# Patient Record
Sex: Female | Born: 1935 | Race: White | Hispanic: No | Smoking: Former smoker
Health system: Southern US, Community
[De-identification: ages and names within clinical notes are randomized; demographics above are authoritative.]

## PROBLEM LIST (undated history)

## (undated) DIAGNOSIS — I10 Essential (primary) hypertension: Secondary | ICD-10-CM

## (undated) HISTORY — PX: HIP SURGERY: SHX245

## (undated) HISTORY — PX: CHOLECYSTECTOMY: SHX55

## (undated) HISTORY — PX: OTHER SURGICAL HISTORY: SHX169

---

## 2015-07-30 ENCOUNTER — Emergency Department
Admission: EM | Admit: 2015-07-30 | Discharge: 2015-07-30 | Disposition: A | Discharge status: Home / Self Care | Payer: Medicare Other | Attending: Emergency Medicine | Admitting: Emergency Medicine

## 2015-07-30 ENCOUNTER — Encounter: Payer: Self-pay | Admitting: Emergency Medicine

## 2015-07-30 ENCOUNTER — Emergency Department: Payer: Medicare Other

## 2015-07-30 DIAGNOSIS — Z79899 Other long term (current) drug therapy: Secondary | ICD-10-CM | POA: Diagnosis not present

## 2015-07-30 DIAGNOSIS — M791 Myalgia: Secondary | ICD-10-CM | POA: Diagnosis present

## 2015-07-30 DIAGNOSIS — Z87891 Personal history of nicotine dependence: Secondary | ICD-10-CM | POA: Diagnosis not present

## 2015-07-30 DIAGNOSIS — M7918 Myalgia, other site: Secondary | ICD-10-CM

## 2015-07-30 DIAGNOSIS — Y9289 Other specified places as the place of occurrence of the external cause: Secondary | ICD-10-CM | POA: Insufficient documentation

## 2015-07-30 DIAGNOSIS — Y939 Activity, unspecified: Secondary | ICD-10-CM | POA: Diagnosis not present

## 2015-07-30 DIAGNOSIS — I10 Essential (primary) hypertension: Secondary | ICD-10-CM | POA: Insufficient documentation

## 2015-07-30 DIAGNOSIS — W06XXXA Fall from bed, initial encounter: Secondary | ICD-10-CM | POA: Diagnosis not present

## 2015-07-30 DIAGNOSIS — Z791 Long term (current) use of non-steroidal anti-inflammatories (NSAID): Secondary | ICD-10-CM | POA: Diagnosis not present

## 2015-07-30 DIAGNOSIS — Y999 Unspecified external cause status: Secondary | ICD-10-CM | POA: Diagnosis not present

## 2015-07-30 DIAGNOSIS — W19XXXA Unspecified fall, initial encounter: Secondary | ICD-10-CM

## 2015-07-30 HISTORY — DX: Essential (primary) hypertension: I10

## 2015-07-30 LAB — URINALYSIS COMPLETE WITH MICROSCOPIC (ARMC ONLY)
BILIRUBIN URINE: NEGATIVE
Bacteria, UA: NONE SEEN
GLUCOSE, UA: NEGATIVE mg/dL
KETONES UR: NEGATIVE mg/dL
LEUKOCYTES UA: NEGATIVE
Nitrite: NEGATIVE
PH: 8 (ref 5.0–8.0)
Protein, ur: NEGATIVE mg/dL
Specific Gravity, Urine: 1.008 (ref 1.005–1.030)

## 2015-07-30 LAB — CBC
HCT: 33.9 % — ABNORMAL LOW (ref 35.0–47.0)
HEMOGLOBIN: 11.3 g/dL — AB (ref 12.0–16.0)
MCH: 30 pg (ref 26.0–34.0)
MCHC: 33.3 g/dL (ref 32.0–36.0)
MCV: 90.1 fL (ref 80.0–100.0)
PLATELETS: 489 10*3/uL — AB (ref 150–440)
RBC: 3.77 MIL/uL — AB (ref 3.80–5.20)
RDW: 15.1 % — ABNORMAL HIGH (ref 11.5–14.5)
WBC: 8.5 10*3/uL (ref 3.6–11.0)

## 2015-07-30 LAB — BASIC METABOLIC PANEL
Anion gap: 8 (ref 5–15)
BUN: 17 mg/dL (ref 6–20)
CHLORIDE: 103 mmol/L (ref 101–111)
CO2: 28 mmol/L (ref 22–32)
CREATININE: 0.5 mg/dL (ref 0.44–1.00)
Calcium: 9.7 mg/dL (ref 8.9–10.3)
GFR calc Af Amer: >60 mL/min (ref >60)
GFR calc non Af Amer: >60 mL/min (ref >60)
GLUCOSE: 85 mg/dL (ref 65–99)
POTASSIUM: 3.9 mmol/L (ref 3.5–5.1)
Sodium: 139 mmol/L (ref 135–145)

## 2015-07-30 LAB — TROPONIN I: Troponin I: <0.03 ng/mL (ref <0.031)

## 2015-07-30 MED ORDER — MORPHINE SULFATE (PF) 4 MG/ML IV SOLN: 4.0000 mg | Freq: Once | INTRAVENOUS | Status: DC

## 2015-07-30 MED ORDER — ONDANSETRON HCL 4 MG/2ML IJ SOLN: 4.0000 mg | Freq: Once | INTRAMUSCULAR | Status: DC

## 2015-07-30 NOTE — ED Notes (Signed)
This nurse and Kennith Centerracey, EDT ambulated patient in room with walker.  Pt walked several steps with some assistance under arms.  Attempted to call [facility] to inquire about patient's baseline walking, no answer, will attempt to call back.

## 2015-07-30 NOTE — ED Notes (Signed)
This nurse spoke with Lavern at [facility] who states patient uses wheelchair and has assistance from wheelchair to bed.  Report given to Lavern about patient's discharge.  Discharge understood and no further questions for this nurse at this time.

## 2015-07-30 NOTE — ED Notes (Signed)
Pt. States she fell this evening on her "buttocks".  Pt. Denies LOC or hitting her head.  Pt. Appears to have older laceration on lt. Orbital.  Pt. Has bruising lt. Hip, which also looks like an older injury.  Pt. Is not a good historian.  Pt. Has good strength in both lower extremities.  Pt. Has not complaints at this time.

## 2015-07-30 NOTE — Discharge Instructions (Signed)

## 2015-07-30 NOTE — ED Provider Notes (Signed)
[facility] Emergency Department Provider Note   ____________________________________________  Time seen: Approximately 546 AM  I have reviewed the triage vital signs and the nursing notes.   HISTORY  Chief Complaint Fall    HPI Raynelle Fujikawa is a 82 y.o. female comes into the hospital today after a fall. The patient reports that she fell onto her buttocks. She reports that she does not remember exactly how she fell and she thinks she may have rolled off the bed. The patient denies hitting her head and she reports she does fall frequently. She has no chest pain no shortness of breath no nausea or vomiting. She reports that her low back hurts after the fall but currently her pain is a 0 out of 10 in intensity. The patient reports that she is here to be evaluated him checked out because she fell. She did not take anything for pain and reports that she is able to move everything at this time.   Past Medical History  Diagnosis Date  . Hypertension     There are no active problems to display for this patient.   Past Surgical History  Procedure Laterality Date  . Cholecystectomy    . Hip surgery    . Dnc      Current Outpatient Rx  Name  Route  Sig  Dispense  Refill  . acetaminophen (TYLENOL) 325 MG tablet   Oral   Take 975 mg by mouth 2 (two) times daily.         . Amino Acids-Protein Hydrolys (FEEDING SUPPLEMENT, PRO-STAT SUGAR FREE 64,) LIQD   Oral   Take 30 mLs by mouth 2 (two) times daily.         Marland Kitchen donepezil (ARICEPT) 5 MG tablet   Oral   Take 5 mg by mouth at bedtime.         Marland Kitchen OLANZapine (ZYPREXA) 2.5 MG tablet   Oral   Take 2.5 mg by mouth 3 (three) times daily.           Allergies Review of patient's allergies indicates no known allergies.  No family history on file.  Social History Social History  Substance Use Topics  . Smoking status: Former Games developer  . Smokeless tobacco: None  . Alcohol Use: Yes    Review  of Systems Constitutional: No fever/chills Eyes: No visual changes. ENT: No sore throat. Cardiovascular: Denies chest pain. Respiratory: Denies shortness of breath. Gastrointestinal: No abdominal pain.  No nausea, no vomiting.  No diarrhea.  No constipation. Genitourinary: Negative for dysuria. Musculoskeletal:  back pain. Skin: Negative for rash. Neurological: Negative for headaches, focal weakness or numbness.  10-point ROS otherwise negative.  ____________________________________________   PHYSICAL EXAM:  VITAL SIGNS: ED Triage Vitals  Enc Vitals Group     BP 07/30/15 0515 128/71 mmHg     Pulse Rate 07/30/15 0515 73     Resp 07/30/15 0515 18     Temp 07/30/15 0515 97.8 F (36.6 C)     Temp Source 07/30/15 0515 Oral     SpO2 07/30/15 0515 99 %     Weight 07/30/15 0515 140 lb (63.504 kg)     Height 07/30/15 0515  (1.676 m)     Head Cir --      Peak Flow --      Pain Score --      Pain Loc --      Pain Edu? --      Excl. in GC? --  Constitutional: Alert . Well appearing and in no acute distress. Eyes: Conjunctivae are normal. PERRL. EOMI. Head: Atraumatic. Nose: No congestion/rhinnorhea. Mouth/Throat: Mucous membranes are moist.  Oropharynx non-erythematous. Cardiovascular: Normal rate, regular rhythm. Grossly normal heart sounds.  Good peripheral circulation. Respiratory: Normal respiratory effort.  No retractions. Lungs CTAB. Gastrointestinal: Soft and nontender. No distention. Positive bowel sounds Musculoskeletal: No pelvis tenderness to palpation, no low back tenderness to palpation.. Neurologic:  Normal speech and language.  Skin:  Skin is warm, dry and intact.  Psychiatric: Mood and affect are normal.   ____________________________________________   LABS (all labs ordered are listed, but only abnormal results are displayed)  Labs Reviewed  URINALYSIS COMPLETEWITH MICROSCOPIC ([facility] ONLY) - Abnormal; Notable for the following:    Color, Urine  YELLOW (*)    APPearance CLOUDY (*)    Hgb urine dipstick 1+ (*)    Squamous Epithelial / LPF 0-5 (*)    All other components within normal limits  CBC - Abnormal; Notable for the following:    RBC 3.77 (*)    Hemoglobin 11.3 (*)    HCT 33.9 (*)    RDW 15.1 (*)    Platelets 489 (*)    All other components within normal limits  BASIC METABOLIC PANEL  TROPONIN I   ____________________________________________  EKG  None ____________________________________________  RADIOLOGY  Lumbar spine x-ray: Negative for acute lumbar spine fracture  Pelvis x-ray: No acute abnormality. ____________________________________________   PROCEDURES  Procedure(s) performed: None  Critical Care performed: No  ____________________________________________   INITIAL IMPRESSION / ASSESSMENT AND PLAN / ED COURSE  Pertinent labs & imaging results that were available during my care of the patient were reviewed by me and considered in my medical decision making (see chart for details).  This is an 82 year old female who comes into the hospital tonight after a fall. The patient reports that she does not know exactly how she fell but she landed on her buttocks. The patient's x-rays at this time are unremarkable. The patient's urinalysis is also unremarkable. I will have the patient ambulate to determine if she is having irregular pain with her blood work returned she will be dispositioned.  The patient's blood work is unremarkable. We will have the patient walk around to determine if she has any worsened pain if she is doing well she'll be discharged home. ____________________________________________   FINAL CLINICAL IMPRESSION(S) / ED DIAGNOSES  Final diagnoses:  Fall, initial encounter  Musculoskeletal pain      NEW MEDICATIONS STARTED DURING THIS VISIT:  New Prescriptions   No medications on file     Note:  This document was prepared using Dragon voice recognition software and may  include unintentional dictation errors.    Rebecka ApleyAllison P Glendale Wherry, MD 07/30/15 [phone]

## 2015-07-30 NOTE — ED Provider Notes (Signed)
Patient was able to walk minimally, she has been accepted back to [facility]. Apparently is for the most part wheelchair bound at baseline.  Misty FilbertJonathan E Brettany Sydney, MD 07/30/15 1226

## 2015-07-30 NOTE — ED Notes (Signed)
Pt. States she does not remember fall.  Pt. Denies hitting head. Pt. Reported to have dementia.

## 2015-07-30 NOTE — ED Notes (Signed)
This nurse attempted to ambulate patient in room.  Pt having a lot of difficulty, states "this doesn't feel right".  Pt very weak and unsteady.  MD notified.

## 2016-02-19 ENCOUNTER — Encounter: Payer: Self-pay | Admitting: Emergency Medicine

## 2016-02-19 ENCOUNTER — Emergency Department: Payer: Medicare Other

## 2016-02-19 ENCOUNTER — Inpatient Hospital Stay
Admission: EM | Admit: 2016-02-19 | Discharge: 2016-02-20 | DRG: 871 | Disposition: A | Discharge status: Hospice | Payer: Medicare Other | Attending: Internal Medicine | Admitting: Internal Medicine

## 2016-02-19 DIAGNOSIS — E870 Hyperosmolality and hypernatremia: Secondary | ICD-10-CM | POA: Diagnosis present

## 2016-02-19 DIAGNOSIS — A419 Sepsis, unspecified organism: Secondary | ICD-10-CM | POA: Diagnosis present

## 2016-02-19 DIAGNOSIS — E860 Dehydration: Secondary | ICD-10-CM | POA: Diagnosis present

## 2016-02-19 DIAGNOSIS — R0902 Hypoxemia: Secondary | ICD-10-CM | POA: Diagnosis present

## 2016-02-19 DIAGNOSIS — E872 Acidosis: Secondary | ICD-10-CM | POA: Diagnosis present

## 2016-02-19 DIAGNOSIS — Z87891 Personal history of nicotine dependence: Secondary | ICD-10-CM

## 2016-02-19 DIAGNOSIS — R627 Adult failure to thrive: Secondary | ICD-10-CM | POA: Diagnosis present

## 2016-02-19 DIAGNOSIS — F419 Anxiety disorder, unspecified: Secondary | ICD-10-CM | POA: Diagnosis present

## 2016-02-19 DIAGNOSIS — N179 Acute kidney failure, unspecified: Secondary | ICD-10-CM | POA: Diagnosis present

## 2016-02-19 DIAGNOSIS — E878 Other disorders of electrolyte and fluid balance, not elsewhere classified: Secondary | ICD-10-CM | POA: Diagnosis present

## 2016-02-19 DIAGNOSIS — Z515 Encounter for palliative care: Secondary | ICD-10-CM | POA: Diagnosis present

## 2016-02-19 DIAGNOSIS — Z7189 Other specified counseling: Secondary | ICD-10-CM | POA: Diagnosis not present

## 2016-02-19 DIAGNOSIS — N390 Urinary tract infection, site not specified: Secondary | ICD-10-CM | POA: Diagnosis present

## 2016-02-19 DIAGNOSIS — R571 Hypovolemic shock: Secondary | ICD-10-CM | POA: Diagnosis present

## 2016-02-19 DIAGNOSIS — F0390 Unspecified dementia without behavioral disturbance: Secondary | ICD-10-CM | POA: Diagnosis present

## 2016-02-19 DIAGNOSIS — Z7401 Bed confinement status: Secondary | ICD-10-CM

## 2016-02-19 DIAGNOSIS — R6520 Severe sepsis without septic shock: Secondary | ICD-10-CM | POA: Diagnosis present

## 2016-02-19 DIAGNOSIS — Z66 Do not resuscitate: Secondary | ICD-10-CM | POA: Diagnosis present

## 2016-02-19 DIAGNOSIS — I10 Essential (primary) hypertension: Secondary | ICD-10-CM | POA: Diagnosis present

## 2016-02-19 DIAGNOSIS — L8990 Pressure ulcer of unspecified site, unspecified stage: Secondary | ICD-10-CM | POA: Insufficient documentation

## 2016-02-19 DIAGNOSIS — R0603 Acute respiratory distress: Secondary | ICD-10-CM | POA: Diagnosis not present

## 2016-02-19 LAB — CBC WITH DIFFERENTIAL/PLATELET
Basophils Absolute: 0 10*3/uL (ref 0–0.1)
Basophils Relative: 0 %
EOS ABS: 0 10*3/uL (ref 0–0.7)
EOS PCT: 0 %
HCT: 42.9 % (ref 35.0–47.0)
Hemoglobin: 12.8 g/dL (ref 12.0–16.0)
LYMPHS ABS: 1.6 10*3/uL (ref 1.0–3.6)
Lymphocytes Relative: 6 %
MCH: 30.4 pg (ref 26.0–34.0)
MCHC: 29.9 g/dL — AB (ref 32.0–36.0)
MCV: 101.5 fL — ABNORMAL HIGH (ref 80.0–100.0)
MONO ABS: 0.8 10*3/uL (ref 0.2–0.9)
Monocytes Relative: 3 %
Neutro Abs: 23.1 10*3/uL — ABNORMAL HIGH (ref 1.4–6.5)
Neutrophils Relative %: 91 %
PLATELETS: 352 10*3/uL (ref 150–440)
RBC: 4.22 MIL/uL (ref 3.80–5.20)
RDW: 16.7 % — AB (ref 11.5–14.5)
WBC: 25.5 10*3/uL — AB (ref 3.6–11.0)

## 2016-02-19 LAB — COMPREHENSIVE METABOLIC PANEL
ALK PHOS: 73 U/L (ref 38–126)
ALT: 17 U/L (ref 14–54)
AST: 33 U/L (ref 15–41)
Albumin: 2.9 g/dL — ABNORMAL LOW (ref 3.5–5.0)
BUN: 111 mg/dL — AB (ref 6–20)
CALCIUM: 8.5 mg/dL — AB (ref 8.9–10.3)
CO2: 18 mmol/L — AB (ref 22–32)
CREATININE: 2.67 mg/dL — AB (ref 0.44–1.00)
Chloride: >130 mmol/L (ref 101–111)
GFR, EST AFRICAN AMERICAN: 18 mL/min — AB (ref >60)
GFR, EST NON AFRICAN AMERICAN: 15 mL/min — AB (ref >60)
Glucose, Bld: 143 mg/dL — ABNORMAL HIGH (ref 65–99)
Potassium: 4 mmol/L (ref 3.5–5.1)
SODIUM: 169 mmol/L — AB (ref 135–145)
Total Bilirubin: 0.8 mg/dL (ref 0.3–1.2)
Total Protein: 6.6 g/dL (ref 6.5–8.1)

## 2016-02-19 LAB — URINALYSIS COMPLETE WITH MICROSCOPIC (ARMC ONLY)
BACTERIA UA: NONE SEEN
Bilirubin Urine: NEGATIVE
Glucose, UA: NEGATIVE mg/dL
Hgb urine dipstick: NEGATIVE
KETONES UR: NEGATIVE mg/dL
Nitrite: NEGATIVE
PH: 8 (ref 5.0–8.0)
SQUAMOUS EPITHELIAL / LPF: NONE SEEN
Specific Gravity, Urine: 1.018 (ref 1.005–1.030)

## 2016-02-19 LAB — BLOOD GAS, ARTERIAL
Acid-base deficit: 1.1 mmol/L (ref 0.0–2.0)
BICARBONATE: 22.6 mmol/L (ref 20.0–28.0)
FIO2: 1
O2 Saturation: 93.6 %
PATIENT TEMPERATURE: 37
pCO2 arterial: 34 mmHg (ref 32.0–48.0)
pH, Arterial: 7.43 (ref 7.350–7.450)
pO2, Arterial: 67 mmHg — ABNORMAL LOW (ref 83.0–108.0)

## 2016-02-19 LAB — TROPONIN I: Troponin I: 0.12 ng/mL (ref <0.03)

## 2016-02-19 LAB — MRSA PCR SCREENING: MRSA by PCR: NEGATIVE

## 2016-02-19 LAB — LACTIC ACID, PLASMA
LACTIC ACID, VENOUS: 2.5 mmol/L — AB (ref 0.5–1.9)
Lactic Acid, Venous: 4.1 mmol/L (ref 0.5–1.9)

## 2016-02-19 MED ORDER — ONDANSETRON HCL 4 MG/2ML IJ SOLN: 4.0000 mg | Freq: Four times a day (QID) | INTRAMUSCULAR | Status: DC | PRN

## 2016-02-19 MED ORDER — MEROPENEM-SODIUM CHLORIDE 1 GM/50ML IV SOLR
1.0000 g | Freq: Once | INTRAVENOUS | Status: DC
  Filled 2016-02-19: qty 50

## 2016-02-19 MED ORDER — ACETAMINOPHEN 325 MG PO TABS: 650.0000 mg | ORAL_TABLET | Freq: Four times a day (QID) | ORAL | Status: DC | PRN

## 2016-02-19 MED ORDER — ACETAMINOPHEN 650 MG RE SUPP: 650.0000 mg | Freq: Four times a day (QID) | RECTAL | Status: DC | PRN

## 2016-02-19 MED ORDER — MORPHINE SULFATE (PF) 4 MG/ML IV SOLN
1.0000 mg | INTRAVENOUS | Status: DC | PRN
  Filled 2016-02-19: qty 1

## 2016-02-19 MED ORDER — SODIUM CHLORIDE 0.9 % IV BOLUS (SEPSIS)
1000.0000 mL | Freq: Once | INTRAVENOUS | Status: AC
  Administered 2016-02-19: 1000 mL via INTRAVENOUS

## 2016-02-19 MED ORDER — POLYVINYL ALCOHOL 1.4 % OP SOLN
1.0000 [drp] | OPHTHALMIC | Status: DC | PRN
  Filled 2016-02-19: qty 15

## 2016-02-19 MED ORDER — MEROPENEM-SODIUM CHLORIDE 500 MG/50ML IV SOLR
500.0000 mg | Freq: Two times a day (BID) | INTRAVENOUS | Status: DC
  Filled 2016-02-19: qty 50

## 2016-02-19 MED ORDER — ONDANSETRON HCL 4 MG PO TABS: 4.0000 mg | ORAL_TABLET | Freq: Four times a day (QID) | ORAL | Status: DC | PRN

## 2016-02-19 MED ORDER — DEXTROSE 5 % IV SOLN: INTRAVENOUS | Status: DC

## 2016-02-19 MED ORDER — MORPHINE SULFATE (PF) 4 MG/ML IV SOLN
1.0000 mg | INTRAVENOUS | Status: DC | PRN
  Administered 2016-02-19: 1 mg via INTRAVENOUS

## 2016-02-19 MED ORDER — LORAZEPAM 2 MG/ML IJ SOLN: 1.0000 mg | INTRAMUSCULAR | Status: DC | PRN

## 2016-02-19 MED ORDER — PIPERACILLIN-TAZOBACTAM 3.375 G IVPB 30 MIN
3.3750 g | Freq: Once | INTRAVENOUS | Status: AC
  Administered 2016-02-19: 3.375 g via INTRAVENOUS
  Filled 2016-02-19: qty 50

## 2016-02-19 MED ORDER — VANCOMYCIN HCL IN DEXTROSE 1-5 GM/200ML-% IV SOLN
1000.0000 mg | Freq: Once | INTRAVENOUS | Status: AC
  Administered 2016-02-19: 1000 mg via INTRAVENOUS
  Filled 2016-02-19: qty 200

## 2016-02-19 MED ORDER — MORPHINE SULFATE (PF) 4 MG/ML IV SOLN: 0.5000 mg | INTRAVENOUS | Status: DC | PRN

## 2016-02-19 MED ORDER — MEROPENEM 1 G IV SOLR: 1.0000 g | Freq: Three times a day (TID) | INTRAVENOUS | Status: DC

## 2016-02-19 MED ORDER — HEPARIN SODIUM (PORCINE) 5000 UNIT/ML IJ SOLN: 5000.0000 [IU] | Freq: Three times a day (TID) | INTRAMUSCULAR | Status: DC

## 2016-02-19 MED ORDER — GLYCOPYRROLATE 0.2 MG/ML IJ SOLN
0.2000 mg | INTRAMUSCULAR | Status: DC | PRN
  Filled 2016-02-19: qty 1

## 2016-02-19 MED ORDER — MORPHINE SULFATE (PF) 2 MG/ML IV SOLN
INTRAVENOUS | Status: AC
  Administered 2016-02-19: 1 mg via INTRAVENOUS
  Filled 2016-02-19: qty 1

## 2016-02-19 NOTE — ED Triage Notes (Signed)
Pt for [facility] for respiratory distress. Per ems pt was 55% on room air . Pt arrived on NRB @15  liters. Pt with eyes open but not responsive to stimulation. Recent treatment for UTI.

## 2016-02-19 NOTE — Progress Notes (Signed)
CH responded to an OR for End of Life. Pt was in bed and non-responsive. Daughter and her friend were bedside. Daughter reflected on Pt and her life. CH provided the ministry of empathetic listening and prayer. CH is available for follow up as needed.    02/19/16 1500  Clinical Encounter Type  Visited With Family;Patient and family together  Visit Type Initial;Spiritual support;Patient actively dying  Referral From Nurse  Spiritual Encounters  Spiritual Needs Prayer;Emotional;Grief support  Stress Factors  Family Stress Factors Major life changes

## 2016-02-19 NOTE — Clinical Social Work Note (Signed)
Patient has been admitted this afternoon from [facility]. Patient's prognosis is grim and patient is not expected to survive this illness. CSW will wait to complete full assessment at this time. York SpanielMonica Jiayi Lengacher MSW,LCSW [phone]

## 2016-02-19 NOTE — Consult Note (Signed)
Consultation Note Date: 02/19/2016   Patient Name: Misty Mcclure  DOB: October 13, 1930  MRN: [id number]  Age / Sex: 82 y.o., female  PCP: Pcp Not In System Referring Physician: Fritzi Mandes, MD  Reason for Consultation: Establishing goals of care  HPI/Patient Profile: 82 y.o. female  with past medical history of dementia admitted on 02/19/2016 with altered mental status and hypoxia and found to have likely urosepsis. Unlikely to survive hospitalization.   Clinical Assessment and Goals of Care: I met today at Misty Mcclure's bedside along with her daughter, Vanita Ingles, and friend, Marlowe Kays. Vanita Ingles is tearful but is now ready for full comfort care. Vanita Ingles says that her mother was a vibrant person. 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 She was active in garden club and multiple social activities and cared for her husband when he had Lewy bodies dementia. Vanita Ingles says that the last 6 months have been extremely difficult and that her mother has no QOL. Vanita Ingles knows that her mother is ready to die. Vanita Ingles also feels like she saw all her children except for 1 son (currently flying from [place]) and most of her children over Thanksgiving and Vanita Ingles feels like she was just waiting for this. Misty Mcclure's goal is that her mother not suffer.   Emotional support and therapeutic listening provided. Chaplain enters at bedside. Will proceed with full comfort care and will consider hospice placement tomorrow if appropriate although I doubt that she will be stable enough for transfer if still living but will reassess in am. Discussed with Dr. Posey Pronto.   Primary Decision Maker NEXT OF KIN 4 children, daughter at bedside and has been discussing with her siblings    SUMMARY OF RECOMMENDATIONS   Full comfort care  Code Status/Advance Care Planning:  DNR   Symptom Management:   Dyspnea/pain: Morphine 1-3 mg every hour prn.   Secretions: Robinul prn.    Anxiety: Ativan prn.   Palliative Prophylaxis:   Aspiration, Frequent Pain Assessment, Oral Care and Turn Reposition  Additional Recommendations (Limitations, Scope, Preferences):  Full Comfort Care  Psycho-social/Spiritual:   Desire for further Chaplaincy support:yes  Additional Recommendations: Caregiving  Support/Resources, Education on Hospice and Grief/Bereavement Support  Prognosis:   Hours - Days  Discharge Planning: To Be Determined. Likely hospital death.       Primary Diagnoses: Present on Admission: . Severe sepsis ([place])   I have reviewed the medical record, interviewed the patient and family, and examined the patient. The following aspects are pertinent.  Past Medical History:  Diagnosis Date  . Hypertension    Social History   Social History  . Marital status: Widowed    Spouse name: N/A  . Number of children: N/A  . Years of education: N/A   Social History Main Topics  . Smoking status: Former Research scientist (life sciences)  . Smokeless tobacco: None  . Alcohol use Yes  . Drug use: Unknown  . Sexual activity: No   Other Topics Concern  . None   Social History Narrative   NA  History reviewed. No pertinent family history. Scheduled Meds: Continuous Infusions: PRN Meds:.acetaminophen **OR** acetaminophen, glycopyrrolate, LORazepam, morphine injection, ondansetron **OR** ondansetron (ZOFRAN) IV, polyvinyl alcohol No Known Allergies Review of Systems  Unable to perform ROS: Acuity of condition    Physical Exam  Constitutional: She appears well-developed.  HENT:  Head: Normocephalic and atraumatic.  Cardiovascular: Normal rate.   Pulmonary/Chest: Effort normal. No accessory muscle usage. No tachypnea. No respiratory distress.  Abdominal: Normal appearance.  Neurological: She is unresponsive.  Nursing note and vitals reviewed.   Vital Signs: BP (!) 92/58 (BP Location: Left Arm)   Pulse 79   Temp 97 F (36.1 C) (Axillary)   Resp (!) 22   Ht 5'  (1.524 m)   Wt 43.1 kg (95 lb)   SpO2 100%   BMI 18.55 kg/m          SpO2: SpO2: 100 % O2 Device:SpO2: 100 % O2 Flow Rate: .O2 Flow Rate (L/min): 15 L/min  IO: Intake/output summary:  Intake/Output Summary (Last 24 hours) at 02/19/16 1521 Last data filed at 02/19/16 1500  Gross per 24 hour  Intake                0 ml  Output                0 ml  Net                0 ml    LBM:   Baseline Weight: Weight: 43.1 kg (95 lb) Most recent weight: Weight: 43.1 kg (95 lb)     Palliative Assessment/Data: PPS: 10%   Flowsheet Rows   Flowsheet Row Most Recent Value  Intake Tab  Referral Department  Hospitalist  Unit at Time of Referral  ER  Palliative Care Primary Diagnosis  Sepsis/Infectious Disease  Date Notified  02/19/16  Palliative Care Type  New Palliative care  Reason for referral  Clarify Goals of Care  Date of Admission  02/19/16  # of days IP prior to Palliative referral  0  Clinical Assessment  Psychosocial & Spiritual Assessment  Palliative Care Outcomes      Time In: 1430 Time Out: 1530 Time Total: 51mn Greater than 50%  of this time was spent counseling and coordinating care related to the above assessment and plan.  Signed by: AVinie Sill NP Palliative Medicine Team Pager # [phone](M-F 8a-5p) Team Phone # [phone](Nights/Weekends)

## 2016-02-19 NOTE — Progress Notes (Signed)
Pharmacy Antibiotic Note  Bevelyn Bucklesauline Nikolic is a 82 y.o. female admitted on 02/19/2016 with sepsis.  Pharmacy has been consulted for meropenem dosing.  Plan: Meropenem 1000 mg dose ordered once. Will start meropenem 500 mg IV BID.  Height: 5' (152.4 cm) Weight: 95 lb (43.1 kg) IBW/kg (Calculated) : 45.5  Temp (24hrs), Avg:97 F (36.1 C), Min:97 F (36.1 C), Max:97 F (36.1 C)   Recent Labs Lab 02/19/16 0928 02/19/16 1314  WBC 25.5*  --   CREATININE 2.67*  --   LATICACIDVEN 4.1* 2.5*    Estimated Creatinine Clearance: 10.9 mL/min (by C-G formula based on SCr of 2.67 mg/dL (H)).    No Known Allergies  Antimicrobials this admission: meropenem 11/29 >>  Vancomycin and zosyn once in ED 11/29  Dose adjustments this admission:  Microbiology results:  Thank you for allowing pharmacy to be a part of this patient's care.  Cindi CarbonMary M Yordy Matton, PharmD 02/19/2016 2:46 PM

## 2016-02-19 NOTE — ED Notes (Signed)
Pt in bed with eyes open, pt will not to respond to verbal or painful stimuli, urine thick white with particles, daughter at bedside

## 2016-02-19 NOTE — ED Notes (Signed)
Critical labs: lactic acid 4.1, troponin 0.12, chloride greater than 130, reported to Dr Mayford KnifeWilliams

## 2016-02-19 NOTE — ED Provider Notes (Signed)
[facility] Emergency Department Provider Note     L5 caveat: Review of systems and history cannot be obtained. Patient is poorly responsive, has dementia   Time seen: ----------------------------------------- 9:28 AM on 02/19/2016 -----------------------------------------    I have reviewed the triage vital signs and the nursing notes.   HISTORY  Chief Complaint No chief complaint on file.    HPI Misty Mcclure is a 82 y.o. female brought from [facility] for respiratory distress. Oxygen saturations were noted to be in the 50s. Patient's baseline is altered with a history of dementia but she is less responsive than normal according to nursing home staff and EMS. Reportedly in route she was hypoxic despite a nonrebreather and hypotensive.   Past Medical History:  Diagnosis Date  . Hypertension     There are no active problems to display for this patient.   Past Surgical History:  Procedure Laterality Date  . CHOLECYSTECTOMY    . DNC    . HIP SURGERY      Allergies Patient has no known allergies.  Social History Social History  Substance Use Topics  . Smoking status: Former Games developermoker  . Smokeless tobacco: Not on file  . Alcohol use Yes    Review of Systems Unknown, positive for respiratory distress ____________________________________________   PHYSICAL EXAM:  VITAL SIGNS: ED Triage Vitals  Enc Vitals Group     BP      Pulse      Resp      Temp      Temp src      SpO2      Weight      Height      Head Circumference      Peak Flow      Pain Score      Pain Loc      Pain Edu?      Excl. in GC?     Constitutional: Patient's eyes are open but she is not alert or responsive Eyes: Conjunctivae are normal. PERRL.  ENT   Head: Normocephalic and atraumatic.   Nose: No congestion/rhinnorhea.   Mouth/Throat: Mucous membranes are moist.   Neck: No stridor. Cardiovascular: Normal rate, regular rhythm. No  murmurs, rubs, or gallops. Respiratory: Tachypnea with mostly clear breath sounds bilaterally, shallow breathing Gastrointestinal: Soft and nontender. Normal bowel sounds Musculoskeletal: Limited range of motion, no obvious edema Neurologic:  Patient occasionally withdraws from pain, otherwise unresponsive Skin:  Skin is warm, dry and intact. No rash noted. ____________________________________________  EKG: Interpreted by me. Sinus tachycardia with a rate of 100 bpm, normal PR interval, normal QRS width, normal QT, after anterior fascicular block  ____________________________________________  ED COURSE:  Pertinent labs & imaging results that were available during my care of the patient were reviewed by me and considered in my medical decision making (see chart for details). Clinical Course   Patient presents to ER with respiratory distress, likely pneumonia and sepsis. We will assess with labs and initiate sepsis protocol. Patient is DO NOT RESUSCITATE and DO NOT INTUBATE.  Procedures ____________________________________________   LABS (pertinent positives/negatives)  Labs Reviewed  LACTIC ACID, PLASMA - Abnormal; Notable for the following:       Result Value   Lactic Acid, Venous 4.1 (*)    All other components within normal limits  COMPREHENSIVE METABOLIC PANEL - Abnormal; Notable for the following:    Chloride >130 (*)    CO2 18 (*)    Glucose, Bld 143 (*)  Calcium 8.5 (*)    Albumin 2.9 (*)    All other components within normal limits  CBC WITH DIFFERENTIAL/PLATELET - Abnormal; Notable for the following:    WBC 25.5 (*)    MCV 101.5 (*)    MCHC 29.9 (*)    RDW 16.7 (*)    Neutro Abs 23.1 (*)    All other components within normal limits  URINALYSIS COMPLETEWITH MICROSCOPIC ([facility] ONLY) - Abnormal; Notable for the following:    Color, Urine AMBER (*)    APPearance TURBID (*)    Protein, ur >500 (*)    Leukocytes, UA 1+ (*)    All other components within normal  limits  BLOOD GAS, ARTERIAL - Abnormal; Notable for the following:    pO2, Arterial 67 (*)    All other components within normal limits  CULTURE, BLOOD (ROUTINE X 2)  CULTURE, BLOOD (ROUTINE X 2)  URINE CULTURE  LACTIC ACID, PLASMA  TROPONIN I  CRITICAL CARE Performed by: Emily FilbertWilliams, Clotee Schlicker E   Total critical care time: 30 minutes  Critical care time was exclusive of separately billable procedures and treating other patients.  Critical care was necessary to treat or prevent imminent or life-threatening deterioration.  Critical care was time spent personally by me on the following activities: development of treatment plan with patient and/or surrogate as well as nursing, discussions with consultants, evaluation of patient's response to treatment, examination of patient, obtaining history from patient or surrogate, ordering and performing treatments and interventions, ordering and review of laboratory studies, ordering and review of radiographic studies, pulse oximetry and re-evaluation of patient's condition.   RADIOLOGY Images were viewed by me  Chest x-ray IMPRESSION: Chronic bronchitic changes. No evidence of pneumonia nor pulmonary edema.  Thoracic aortic atherosclerosis. ____________________________________________  FINAL ASSESSMENT AND PLAN  Urosepsis, lactic acidosis, Dehydration  Plan: Patient with labs and imaging as dictated above. Family and patient have requested no heroic measures. We will continue management with IV fluids and IV antibiotics. Patient with a remarkable free water deficit and dehydration as well. We had initially started her on saline but will need to change her to less concentrated fluids. I have ordered vancomycin and Zosyn, she may need a dose of meropenem as well for ESBL. I will discuss with the hospitalist for admission.   Emily FilbertWilliams, Aundra Espin E, MD   Note: This dictation was prepared with Dragon dictation. Any transcriptional errors that  result from this process are unintentional    Emily FilbertJonathan E Nagi Furio, MD 02/19/16 1121

## 2016-02-19 NOTE — Progress Notes (Signed)
Family Meeting Note  Advance Directive:yes  Today a meeting took place with the daughter Misty Mcclure  Patient is unresponsive and unable to participate in today's meeting.   The following clinical team members were present during this meeting daughter and her friend The following were discussed:Patient's diagnosis: , Patient's progosis: Very poor Additional follow-up to be provided: Palliative care consulted   Patient's daughter understands patient is severely ill with sepsis severe due to urinary tract infection and severe dehydration with acute kidney injury and hypotension. Patient's son Misty Mcclure is on his way from [place]. We'll continue IV fluids and IV antibiotics. When necessary morphine for comfort. Daughter understands patient did not survive hospital stay. It's matter of days.  Patient is a no code DO NOT RESUSCITATE. Family agreeable with hospice facility if patient shows no improvement.  Time spent during discussion:20 mins  Misty Bert, MD

## 2016-02-20 DIAGNOSIS — L8990 Pressure ulcer of unspecified site, unspecified stage: Secondary | ICD-10-CM | POA: Insufficient documentation

## 2016-02-20 LAB — URINE CULTURE: Culture: NO GROWTH

## 2016-02-20 MED ORDER — MORPHINE SULFATE (CONCENTRATE) 10 MG/0.5ML PO SOLN: 5.0000 mg | ORAL | 0 refills | Status: AC | PRN

## 2016-02-20 MED ORDER — MORPHINE SULFATE (CONCENTRATE) 10 MG/0.5ML PO SOLN
5.0000 mg | ORAL | Status: DC | PRN
  Administered 2016-02-20 (×2): 5 mg via ORAL
  Filled 2016-02-20 (×2): qty 1

## 2016-02-20 NOTE — Clinical Social Work Note (Signed)
Patient discharging to the hospice home today via EMS. Misty Mcclure with Hospice has spoken with the patient's children and made the arrangements. Misty Mcclure MSW,LCSW [phone]

## 2016-02-20 NOTE — Progress Notes (Signed)
Notified by NP Sena SlateAlicia Parks that family has decided on transport to the hospice home. Hospital care team all made aware. Report called to the hospice home, EMS notified for transport. Family all aware. Thank you. Dayna BarkerKaren Robertson RN, BSN, [facility], [facility] liaison [phone]

## 2016-02-20 NOTE — Discharge Instructions (Signed)
Hospice °Hospice is a service that is designed to provide people who are terminally ill and their families with medical, spiritual, and psychological support. Its aim is to improve your quality of life by keeping you as alert and comfortable as possible. Hospice is performed by a team of health care professionals and volunteers who: °· Help keep you comfortable. Hospice can be provided in your home or in a homelike setting. The hospice staff works with your family and friends to help meet your needs. You will enjoy the support of loved ones by receiving much of your basic care from family and friends. °· Provide pain relief and manage your symptoms. The staff supply all necessary medicines and equipment. °· Provide companionship when you are alone. °· Allow you and your family to rest. They may do light housekeeping, prepare meals, and run errands. °· Provide counseling. They will make sure your emotional, spiritual, and social needs and those of your family are being met. °· Provide spiritual care. Spiritual care is individualized to meet your needs and your family's needs. It may involve helping you look at what death means to you, say goodbye, or perform a specific religious ceremony or ritual. °Hospice teams often include: °· A nurse. °· A doctor. °· Social workers. °· Religious leaders (such as a chaplain). °· Trained volunteers. °WHEN SHOULD HOSPICE CARE BEGIN? °Most people who use hospice are believed to have fewer than 6 months to live. Your family and health care providers can help you decide when hospice services should begin. If your condition improves, you may discontinue the program. °WHAT SHOULD I CONSIDER BEFORE SELECTING A PROGRAM? °Most hospice programs are run by nonprofit, independent organizations. Some are affiliated with hospitals, nursing homes, or home health care agencies. Hospice programs can take place in the home or at a hospice center, hospital, or skilled nursing facility. When choosing  a hospice program, ask the following questions: °· What services are available to me? °· What services are offered to my loved ones? °· How involved are my loved ones? °· How involved is my health care provider? °· Who makes up the hospice care team? How are they trained or screened? °· How will my pain and symptoms be managed? °· If my circumstances change, can the services be provided in a different setting, such as my home or in the hospital? °· Is the program reviewed and licensed by the state or certified in some other way? °WHERE CAN I LEARN MORE ABOUT HOSPICE? °You can learn about existing hospice programs in your area from your health care providers. You can also read more about hospice online. The websites of the following organizations contain helpful information: °· The National Hospice and Palliative Care Organization (NHPCO). °· The Hospice Association of America (HAA). °· The Hospice Education Institute. °· The American Cancer Society (ACS). °· Hospice Net. °This information is not intended to replace advice given to you by your health care provider. Make sure you discuss any questions you have with your health care provider. °Document Released: 06/26/2003 Document Revised: 03/14/2013 Document Reviewed: 01/17/2013 °Elsevier Interactive Patient Education © 2017 Elsevier Inc. ° °

## 2016-02-20 NOTE — Progress Notes (Signed)
Writer met with patient's daughter Vanita Ingles dn son Clair Gulling at bedside, expressed confusion regarding a move to the hospice home, they stated that they thought she was "moving to a bigger room". Writer answered several questions, Clair Gulling remained steadfast in his decision not to move his mother as he felt it would be too stressful. Vera wanted her moved, after a long discussion with questions answered and support given, Probation officer contacted Palliative NP Mariane Baumgarten to come and speak with family. Hospital care team all aware. Of note patient was long term care at [facility], and receiving hospice care from [facility] hospice. Thank you. Flo Shanks RN, BSN, [facility] [place], hospital Liaison [phone]

## 2016-02-20 NOTE — Care Management Important Message (Signed)
Important Message  Patient Details  Name: Misty Mcclure MRN: [id number] Date of Birth: [birth year]   Medicare Important Message Given:  N/A - LOS <3 / Initial given by admissions    Chapman FitchBOWEN, Bradee Common T, RN 02/20/2016, 2:27 PM

## 2016-02-20 NOTE — H&P (Signed)
[facility] [place] [facility]   PATIENT NAME: Misty Mcclure    MR#:  [id number]  DATE OF BIRTH:  [birth year]  DATE OF ADMISSION:  02/19/2016  PRIMARY CARE PHYSICIAN: Pcp Not In System   REQUESTING/REFERRING PHYSICIAN: Dr Mayford KnifeWilliams  CHIEF COMPLAINT:   Nonresponsive and respiratory distress HISTORY OF PRESENT ILLNESS:  Misty Mcclure  is a 82 y.o. female with a known history of Hypertension who [facility] comes to the emergency room with respiratory distress. Patient's sats were noted to be in the 50s. She has history of baseline dementia and has been reportedly by daughter that she is bedbound. She is progressively declining over several weeks and hospice was consulted at the facility. Patient was brought in with severe sepsis secondary to UTI. She was started on IV fluids and IV broad-spectrum antibiotics were given. Daughter and her friend were present in the emergency room.  PAST MEDICAL HISTORY:   Past Medical History:  Diagnosis Date  . Hypertension     PAST SURGICAL HISTOIRY:   Past Surgical History:  Procedure Laterality Date  . CHOLECYSTECTOMY    . DNC    . HIP SURGERY      SOCIAL HISTORY:   Social History  Substance Use Topics  . Smoking status: Former Games developermoker  . Smokeless tobacco: Never Used     Comment: unknown  . Alcohol use Yes    FAMILY HISTORY:  History reviewed. No pertinent family history.  DRUG ALLERGIES:  No Known Allergies  REVIEW OF SYSTEMS:  Review of Systems  Unable to perform ROS: Mental status change     MEDICATIONS AT HOME:   Prior to Admission medications   Medication Sig Start Date End Date Taking? Authorizing Provider  collagenase (SANTYL) ointment Apply 1 application topically daily.   Yes Historical Provider, MD  Multiple Vitamins-Iron (MULTIVITAMINS WITH IRON) TABS tablet Take 1 tablet by mouth daily.   Yes Historical Provider, MD  risperiDONE (RISPERDAL) 1  MG tablet Take 1 mg by mouth daily.   Yes Historical Provider, MD  senna (SENOKOT) 8.6 MG tablet Take 2 tablets by mouth at bedtime.   Yes Historical Provider, MD  sertraline (ZOLOFT) 50 MG tablet Take 50 mg by mouth daily.   Yes Historical Provider, MD  acetaminophen (TYLENOL) 325 MG tablet Take 975 mg by mouth 2 (two) times daily.    Historical Provider, MD  Amino Acids-Protein Hydrolys (FEEDING SUPPLEMENT, PRO-STAT SUGAR FREE 64,) LIQD Take 30 mLs by mouth 2 (two) times daily.    Historical Provider, MD      VITAL SIGNS:  Blood pressure 105/60, pulse 96, temperature 98.1 F (36.7 C), temperature source Oral, resp. rate 20, height 5' (1.524 m), weight 43.1 kg (95 lb), SpO2 99 %.  PHYSICAL EXAMINATION:  GENERAL:  82 y.o.-year-old patient lying in the bed with Mild acute distress. Thin cachectic appears critically ill EYES: Pupils equal, round, reactive to light and accommodation. No scleral icterus. Extraocular muscles intact.  HEENT: Head atraumatic, normocephalic. Oropharynx and nasopharynx clear. Dry oral mucosa NECK:  Supple, no jugular venous distention. No thyroid enlargement, no tenderness.  LUNGS: Decreased breath sounds bilaterally, no wheezing, rales,rhonchi or crepitation. No use of accessory muscles of respiration.  CARDIOVASCULAR: S1, S2 normal. No murmurs, rubs, or gallops. Tachycardia ABDOMEN: Soft, nontender, nondistended. Bowel sounds present. No organomegaly or mass.  EXTREMITIES: No pedal edema, cyanosis, or clubbing.  NEUROLOGIC: Unable to assess PSYCHIATRIC: Patient is alert but nonverbal SKIN  chronic skin ulcer present at admission  LABORATORY PANEL:   CBC  Recent Labs Lab 02/19/16 0928  WBC 25.5*  HGB 12.8  HCT 42.9  PLT 352   ------------------------------------------------------------------------------------------------------------------  Chemistries   Recent Labs Lab 02/19/16 0928  NA 169*  K 4.0  CL >130*  CO2 18*  GLUCOSE 143*  BUN 111*   CREATININE 2.67*  CALCIUM 8.5*  AST 33  ALT 17  ALKPHOS 73  BILITOT 0.8   ------------------------------------------------------------------------------------------------------------------  Cardiac Enzymes  Recent Labs Lab 02/19/16 0928  TROPONINI 0.12*   ------------------------------------------------------------------------------------------------------------------  RADIOLOGY:  Dg Chest Port 1 View  Result Date: 02/19/2016 CLINICAL DATA:  Respiratory distress. Recent urinary tract infection. History of hypertension, former smoker. EXAM: PORTABLE CHEST 1 VIEW COMPARISON:  None in PACs FINDINGS: The lungs are well-expanded and clear. The heart and pulmonary vascularity are normal. There is tortuosity of the ascending and descending thoracic aorta. There is calcification in the wall of the aortic arch. There is no pleural effusion or pneumothorax. There is multilevel degenerative disc disease of the thoracic spine. There degenerative changes of both shoulders. IMPRESSION: Chronic bronchitic changes. No evidence of pneumonia nor pulmonary edema. Thoracic aortic atherosclerosis. Electronically Signed   By: David  SwazilandJordan M.D.   On: 02/19/2016 10:00    EKG:   Sinus tachycardia IMPRESSION AND PLAN:  Misty Mcclure  is a 82 y.o. female with a known history of Hypertension who [facility] comes to the emergency room with respiratory distress. Patient's sats were noted to be in the 50s.  1. Severe sepsis secondary to UTI -Patient is DO NOT RESUSCITATE and DO NOT INTUBATE this was confirmed with daughter. She carried a yellow out of facility form. -Admit to medical floor -IV fluids -IV broad-spectrum antibiotics with meropenem -Patient's daughter does not want aggressive measures. She is okay with not starting vasopressors. -Follow counts.  2. Severe hypotension with hypovolemic shock and acute renal failure with hypernatremia hyperchloremia   -Appears due to poor by mouth intake and failure to thrive patient has been declining over the past few weeks per family  -Continue IV D5 water   3. Severe acidosis due to #1  4. Leukocytosis due to infection  5. DVT prophylaxis subcutaneous heparin  Spoke at length with the daughter and her friend. Daughter mentions patient's son is in route from [place]. She wants to continue IV fluids and IV antibiotics till then. She understands very well patient may not survive hospitalization. Comfort measures was offered and a palliative care will see patient. Option about going to hospice home also was discussed.  Patient is a no code DO NOT RESUSCITATE.  All the records are reviewed and case discussed with ED provider. Management plans discusse and therefore the full effect.d with the patient, family and they are in agreement.  CODE STATUS: DO NOT RESUSCITATE   TOTAL TIME TAKING CARE OF THIS PATIENT 55  minutes.    Nethan Caudillo M.D  Between 7am to 6pm - Pager - [phone]  After 6pm go to [email or web address] - password [email or web address]  [facility] [place] [facility]  Office  [phone]  CC: Primary care physician; Pcp Not In System

## 2016-02-20 NOTE — Care Management (Signed)
RNCM entered in error.  CSW facilitating discharge to hospice home. RNCM signing off

## 2016-02-20 NOTE — Progress Notes (Signed)
Nutrition Brief Note  82 y.o. female with a known history of Hypertension who [facility] comes to the emergency room with respiratory distress. She has history of baseline dementia and has been reportedly by daughter that she is bedbound. She is progressively declining over several weeks and hospice was consulted at the facility. Patient was brought in with severe sepsis secondary to UTI.  Chart reviewed and discussed with RN. Patient now transitioning to comfort care. NPO for aspiration risk. Discharging to hospice home today.  No nutrition interventions warranted at this time. Please consult Dietitian as needed.   Helane RimaLeanne Kyasia Steuck, MS, RD, LDN Pager: [phone] After Hours Pager: [phone]

## 2016-02-20 NOTE — Progress Notes (Signed)
New hospice home referral received from [place]. Ms. Ayub is an 82 year old woman with a  Known history of vascular dementia, HTN, arthritis, spinal stenosis and UTI admitted to [facility] on 11/29 for sepsis related to a UTI. WBC on admission 25.5, lactic acid 4.1 she required a nonrebreather mask for decreased oxygen saturations. IV antibiotics were initiated in the ED and Palliative Medicine was consulted. Palliative Medicine NP Mariane Baumgarten met with patient's daughter Vanita Ingles at which time patient was made comfort care.  Patient seen lying in bed, eyes open, no response to verbal stimuli, nonrebreather mask remained in place, mouth care performed, which she did react to. Respiratory rate 28, staff RN [place] notified. Writer spoke to patient's daughter Vanita Ingles via phone who advised that she and her brother were on the way to the hospital. Writer to meet with family at that time to initiate education regarding hospice services with plan to transfer to the hospice home today. Hospital care team all aware. Thank you. Flo Shanks RN, BSN, [place] [facility] [place], [facility] [phone] c

## 2016-02-20 NOTE — Progress Notes (Signed)
Pt d/cto Hospice Home today.   IV removed intact.  Pt transported via EMS .

## 2016-02-20 NOTE — Discharge Summary (Signed)
Misty Mcclure at [place] NAME: Misty Mcclure    MR#:  [id number]  DATE OF BIRTH:  01-22-1931  DATE OF ADMISSION:  02/19/2016   ADMITTING PHYSICIAN: Fritzi Mandes, MD  DATE OF DISCHARGE: 02/20/2016  PRIMARY CARE PHYSICIAN: Juluis Pitch, MD   ADMISSION DIAGNOSIS:  Dehydration [E86.0] Sepsis, due to unspecified organism ([place]) [A41.9] DISCHARGE DIAGNOSIS:  Active Problems:   Severe sepsis (Misty Mcclure)   Goals of care, counseling/discussion   Comfort measures only status   Palliative care encounter   Pressure injury of skin  SECONDARY DIAGNOSIS:   Past Medical History:  Diagnosis Date  . Hypertension    HOSPITAL COURSE:  Syrita Mcclure  is a 82 y.o. female with a known history of Hypertension who [facility] admitted with respiratory distress. Patient's sats were noted to be in the 47s.  1. Severe sepsis secondary to UTI - family met with palliative care and decided to make her comfort care and transfer her to [place]  2. Severe hypotension with hypovolemic shock and acute renal failure with hypernatremia hyperchloremia   3. Severe acidosis due to #1  4. Leukocytosis due to infection  Patient is actively dying and has extremely poor prognosis. DISCHARGE CONDITIONS:  Critically sick - actively dying CONSULTS OBTAINED:   DRUG ALLERGIES:  No Known Allergies DISCHARGE MEDICATIONS:     Medication List    STOP taking these medications   acetaminophen 325 MG tablet Commonly known as:  TYLENOL   collagenase ointment Commonly known as:  SANTYL   feeding supplement (PRO-STAT SUGAR FREE 64) Liqd   multivitamins with iron Tabs tablet   risperiDONE 1 MG tablet Commonly known as:  RISPERDAL   senna 8.6 MG tablet Commonly known as:  SENOKOT   sertraline 50 MG tablet Commonly known as:  ZOLOFT     TAKE these medications   morphine CONCENTRATE 10 MG/0.5ML Soln concentrated  solution Take 0.25 mLs (5 mg total) by mouth every 2 (two) hours as needed for severe pain, anxiety or shortness of breath.        DISCHARGE INSTRUCTIONS:   DIET:  Regular diet DISCHARGE CONDITION:  Critical ACTIVITY:  Bedrest OXYGEN:  Home Oxygen: Yes.    Oxygen Delivery: 2 liters/min via Patient connected to nasal cannula oxygen DISCHARGE LOCATION:  Hospice Home   If you experience worsening of your admission symptoms, develop shortness of breath, life threatening emergency, suicidal or homicidal thoughts you must seek medical attention immediately by calling 911 or calling your MD immediately  if symptoms less severe.  You Must read complete instructions/literature along with all the possible adverse reactions/side effects for all the Medicines you take and that have been prescribed to you. Take any new Medicines after you have completely understood and accpet all the possible adverse reactions/side effects.   Please note  You were cared for by a hospitalist during your hospital stay. If you have any questions about your discharge medications or the care you received while you were in the hospital after you are discharged, you can call the unit and asked to speak with the hospitalist on call if the hospitalist that took care of you is not available. Once you are discharged, your primary care physician will handle any further medical issues. Please note that NO REFILLS for any discharge medications will be authorized once you are discharged, as it is imperative that you return to your primary care physician (or establish a  relationship with a primary care physician if you do not have one) for your aftercare needs so that they can reassess your need for medications and monitor your lab values.    On the day of Discharge:  VITAL SIGNS:  Blood pressure (!) 107/55, pulse (!) 108, temperature 100 F (37.8 C), temperature source Axillary, resp. rate (!) 22, height 5' (1.524 m), weight  43.1 kg (95 lb), SpO2 100 %. PHYSICAL EXAMINATION:  GENERAL:  82 y.o.-year-old patient lying in the bed with no acute distress.  EYES: Pupils equal, round, reactive to light and accommodation. No scleral icterus. Extraocular muscles intact.  HEENT: Head atraumatic, normocephalic. Oropharynx and nasopharynx clear.  NECK:  Supple, no jugular venous distention. No thyroid enlargement, no tenderness.  LUNGS: Normal breath sounds bilaterally, no wheezing, rales,rhonchi or crepitation. No use of accessory muscles of respiration.  CARDIOVASCULAR: S1, S2 normal. No murmurs, rubs, or gallops.  ABDOMEN: Soft, non-tender, non-distended. Bowel sounds present. No organomegaly or mass.  EXTREMITIES: No pedal edema, cyanosis, or clubbing.  NEUROLOGIC: Cranial nerves II through XII are intact. Muscle strength 5/5 in all extremities. Sensation intact. Gait not checked.  PSYCHIATRIC: The patient is alert and oriented x 3.  SKIN: No obvious rash, lesion, or ulcer.  DATA REVIEW:   CBC  Recent Labs Lab 02/19/16 0928  WBC 25.5*  HGB 12.8  HCT 42.9  PLT 352    Chemistries   Recent Labs Lab 02/19/16 0928  NA 169*  K 4.0  CL >130*  CO2 18*  GLUCOSE 143*  BUN 111*  CREATININE 2.67*  CALCIUM 8.5*  AST 33  ALT 17  ALKPHOS 73  BILITOT 0.8    Management plans discussed with the patient, family and they are in agreement.  CODE STATUS: DNR   TOTAL TIME TAKING CARE OF THIS PATIENT: 45 minutes.    Max Sane M.D on 02/20/2016 at 2:46 PM  Between 7am to 6pm - Pager - [phone]  After 6pm go to [email or web address] - Proofreader  [facility] [place] [facility]  Office  [phone]  CC: Primary care physician; Juluis Pitch, MD   Note: This dictation was prepared with Dragon dictation along with smaller phrase technology. Any transcriptional errors that result from this process are unintentional.

## 2016-02-24 LAB — CULTURE, BLOOD (ROUTINE X 2)
CULTURE: NO GROWTH
CULTURE: NO GROWTH

## 2016-03-23 DEATH — deceased

## 2018-02-07 IMAGING — DX DG CHEST 1V PORT
1 series · 1 of 1 positions shown · non-contrast
Comparison: None in PACs

CLINICAL DATA: Respiratory distress. Recent urinary tract
infection. History of hypertension, former smoker.

EXAM:
PORTABLE CHEST 1 VIEW

[chest ap]
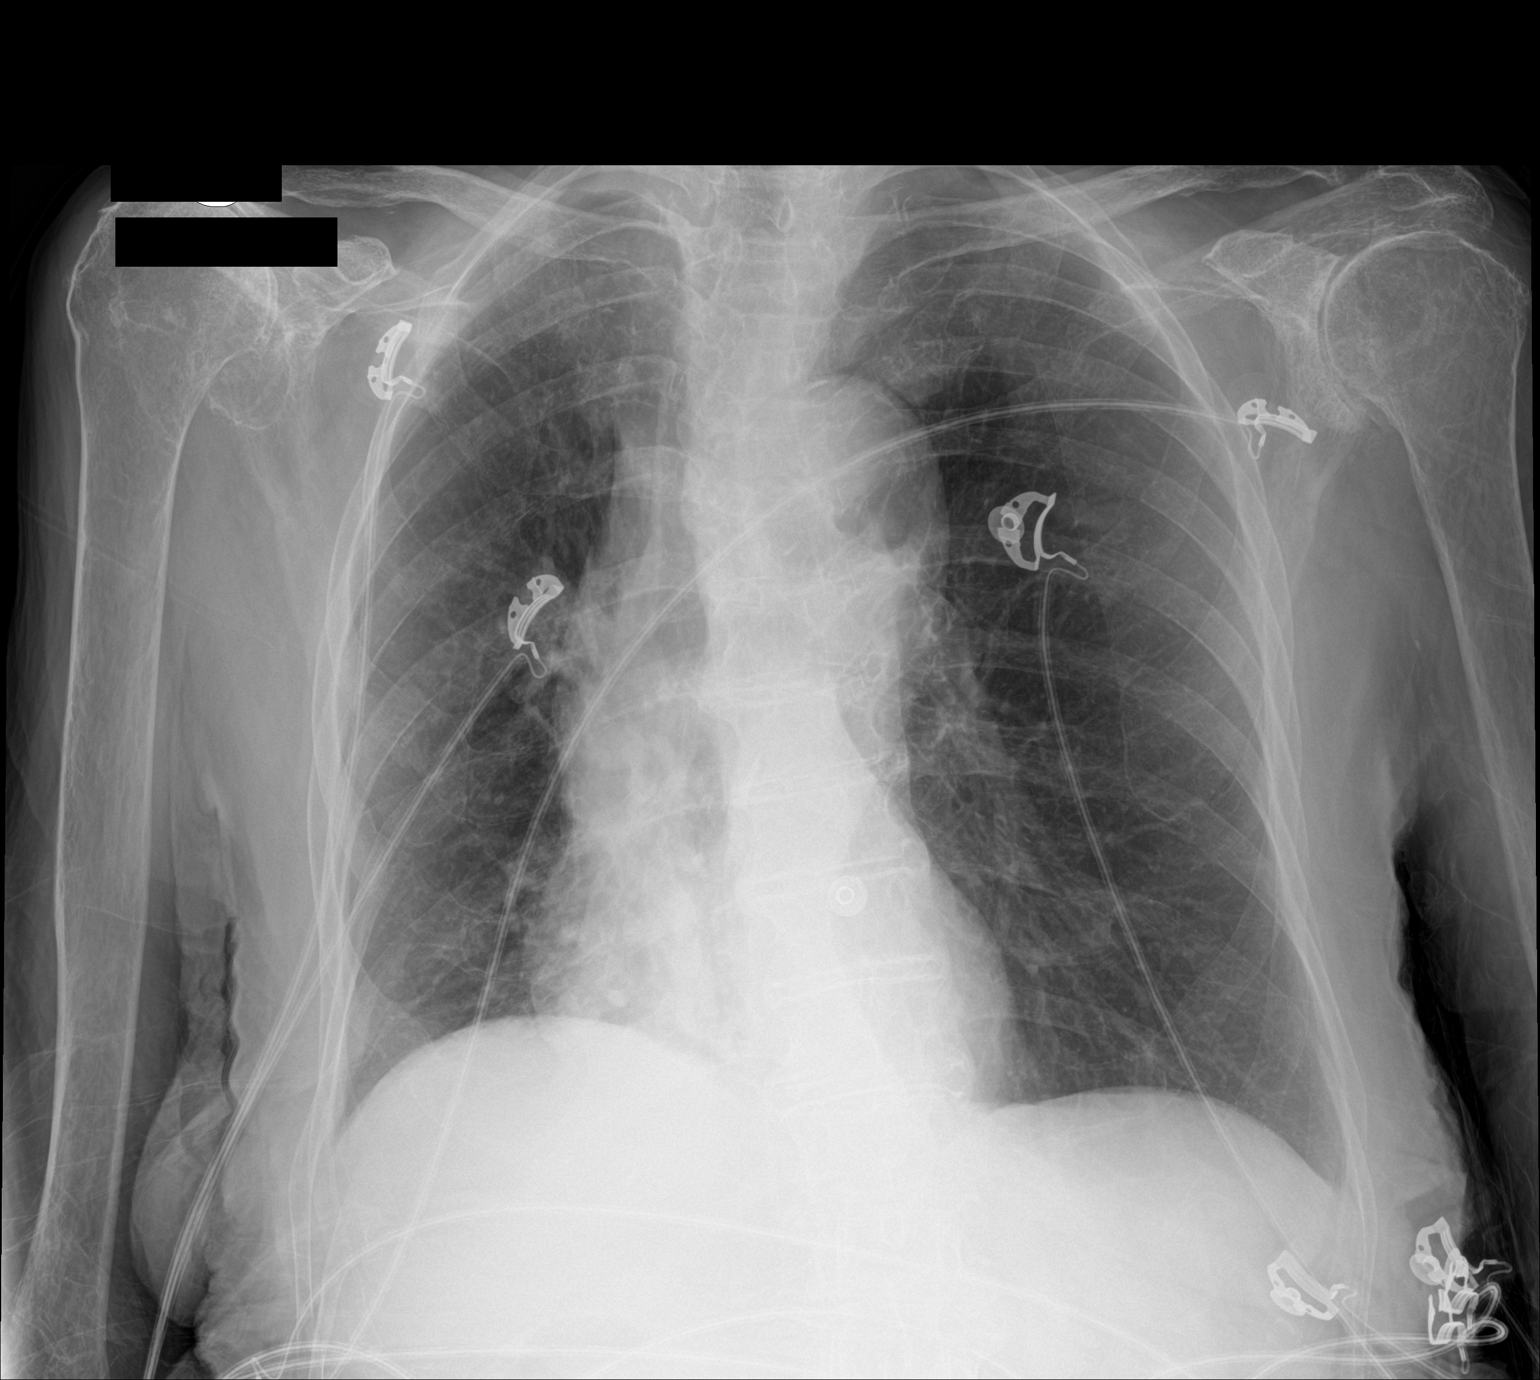

[1 of 1 positions shown; findings below may reference images not displayed]

FINDINGS: The lungs are well-expanded and clear. The heart and pulmonary
vascularity are normal. There is tortuosity of the ascending and
descending thoracic aorta. There is calcification in the wall of the
aortic arch. There is no pleural effusion or pneumothorax. There is
multilevel degenerative disc disease of the thoracic spine. There
degenerative changes of both shoulders.
IMPRESSION: Chronic bronchitic changes. No evidence of pneumonia nor pulmonary
edema.

Thoracic aortic atherosclerosis.
